# Patient Record
Sex: Female | Born: 1975 | Hispanic: Yes | Marital: Married | Smoking: Never smoker
Health system: Southern US, Community
[De-identification: ages and names within clinical notes are randomized; demographics above are authoritative.]

## PROBLEM LIST (undated history)

## (undated) DIAGNOSIS — E079 Disorder of thyroid, unspecified: Secondary | ICD-10-CM

## (undated) HISTORY — PX: TUBAL LIGATION: SHX77

## (undated) HISTORY — DX: Disorder of thyroid, unspecified: E07.9

---

## 2017-07-09 ENCOUNTER — Other Ambulatory Visit (HOSPITAL_COMMUNITY)
Admission: RE | Admit: 2017-07-09 | Discharge: 2017-07-09 | Disposition: A | Discharge status: Home / Self Care | Payer: Self-pay | Source: Ambulatory Visit | Attending: Pathology | Admitting: Pathology

## 2020-01-02 ENCOUNTER — Ambulatory Visit: Payer: Self-pay | Attending: Internal Medicine

## 2020-01-02 DIAGNOSIS — Z23 Encounter for immunization: Secondary | ICD-10-CM

## 2020-01-02 NOTE — Progress Notes (Signed)
   Covid-19 Vaccination Clinic  Name:  Tiffony Kite    MRN: [id number] DOB: [birth year]  01/02/2020  Ms. Wonda Olds was observed post Covid-19 immunization for 15 minutes without incident. She was provided with Vaccine Information Sheet and instruction to access the V-Safe system. Medical Interpreter used.  Ms. Wonda Olds was instructed to call 911 with any severe reactions post vaccine: Marland Kitchen Difficulty breathing  . Swelling of face and throat  . A fast heartbeat  . A bad rash all over body  . Dizziness and weakness   Immunizations Administered    Name Date Dose VIS Date Route   Pfizer COVID-19 Vaccine 01/02/2020  4:01 PM 0.3 mL 09/12/2019 Intramuscular   Manufacturer: [employer], [employer]   Lot: [id number]   NDC: [address]-0

## 2020-01-23 ENCOUNTER — Ambulatory Visit: Payer: Self-pay | Attending: Internal Medicine

## 2020-01-23 DIAGNOSIS — Z23 Encounter for immunization: Secondary | ICD-10-CM

## 2020-01-23 NOTE — Progress Notes (Signed)
   Covid-19 Vaccination Clinic  Name:  Denise Gould    MRN: [id number] DOB: [birth year]  01/23/2020  [name] was observed post Covid-19 immunization for 15 minutes without incident. She was provided with Vaccine Information Sheet and instruction to access the V-Safe system.   [name] was instructed to call 911 with any severe reactions post vaccine: Marland Kitchen Difficulty breathing  . Swelling of face and throat  . A fast heartbeat  . A bad rash all over body  . Dizziness and weakness   Immunizations Administered    Name Date Dose VIS Date Route   Pfizer COVID-19 Vaccine 01/23/2020  3:42 PM 0.3 mL 11/26/2018 Intramuscular   Manufacturer: [employer], [employer]   Lot: [id number]   NDC: [address]-2

## 2020-09-27 ENCOUNTER — Ambulatory Visit: Payer: Self-pay | Attending: Internal Medicine

## 2020-09-27 DIAGNOSIS — Z23 Encounter for immunization: Secondary | ICD-10-CM

## 2020-09-27 NOTE — Progress Notes (Signed)
   Covid-19 Vaccination Clinic  Name:  Denise Gould    MRN: [id number] DOB: 08-04-1976  09/27/2020  [name] was observed post Covid-19 immunization for 15 minutes without incident. She was provided with Vaccine Information Sheet and instruction to access the V-Safe system.   [name] was instructed to call 911 with any severe reactions post vaccine: Marland Kitchen Difficulty breathing  . Swelling of face and throat  . A fast heartbeat  . A bad rash all over body  . Dizziness and weakness   Immunizations Administered    Name Date Dose VIS Date Route   Pfizer COVID-19 Vaccine 09/27/2020  3:13 PM 0.3 mL 07/21/2020 Intramuscular   Manufacturer: [employer], [employer]   Lot: [id number]   NDC: [address]-1

## 2021-02-01 ENCOUNTER — Other Ambulatory Visit: Payer: Self-pay | Admitting: Physician Assistant

## 2021-03-08 ENCOUNTER — Encounter (INDEPENDENT_AMBULATORY_CARE_PROVIDER_SITE_OTHER): Payer: Self-pay

## 2021-03-08 ENCOUNTER — Encounter: Payer: Self-pay | Admitting: *Deleted

## 2021-03-08 ENCOUNTER — Ambulatory Visit
Admission: RE | Admit: 2021-03-08 | Discharge: 2021-03-08 | Disposition: A | Discharge status: Home / Self Care | Payer: Self-pay | Source: Ambulatory Visit | Attending: Oncology | Admitting: Oncology

## 2021-03-08 ENCOUNTER — Other Ambulatory Visit: Payer: Self-pay

## 2021-03-08 ENCOUNTER — Ambulatory Visit: Payer: Self-pay | Attending: Oncology | Admitting: *Deleted

## 2021-03-08 VITALS — BP 130/93 | HR 80 | Temp 100.0°F | Ht 61.02 in | Wt 155.3 lb

## 2021-03-08 DIAGNOSIS — Z0000 Encounter for general adult medical examination without abnormal findings: Secondary | ICD-10-CM | POA: Insufficient documentation

## 2021-03-08 NOTE — Progress Notes (Signed)
  Subjective:     Patient ID: Denise Gould, female   DOB: Feb 19, 1976, 45 y.o.   MRN: [id number]  HPI   [facility] Medical History Record - 03/08/21 9211      Breast History   Screening cycle New    Provider (CBE) [facility]    Initial Mammogram 03/08/21    Last Mammogram Annual    Last Mammogram Date --   6 years ago in [place]   Recent Breast Symptoms None      Breast Cancer History   Breast Cancer History No personal or family history      Previous History of Breast Problems   Breast Surgery or Biopsy --   bilateral benign "bumps"   Breast Implants N/A    BSE Done Never      Gynecological/Obstetrical History   LMP 03/01/21    Is there any chance that the client could be pregnant?  No    Age at menarche 22    Age at menopause n/a    PAP smear history Annually    Date of last PAP  12/17/18    Provider (PAP) [facility]    Age at first live birth 45    Breast fed children No    DES Exposure No    Cervical, Uterine or Ovarian cancer No    Family history of Cervial, Uterine or Ovarian cancer No    Hysterectomy No    Cervix removed No    Ovaries removed No    Laser/Cryosurgery No    Current method of birth control --   tubal ligation   Current method of Estrogen/Hormone replacement None    Smoking history None    Comments No insurance            Review of Systems     Objective:   Physical Exam Chest:  Breasts:     Right: No swelling, bleeding, inverted nipple, mass, nipple discharge, skin change, tenderness, axillary adenopathy or supraclavicular adenopathy.     Left: No swelling, bleeding, inverted nipple, mass, nipple discharge, skin change, tenderness, axillary adenopathy or supraclavicular adenopathy.    Lymphadenopathy:     Upper Body:     Right upper body: No supraclavicular or axillary adenopathy.     Left upper body: No supraclavicular or axillary adenopathy.        Assessment:     45 year old Hispanic female presents to [facility]  for clinical breast exam and mammogram only.  Loyda, the interpreter present during the interview and exam.  Clinical breast exam unremarkable.  Taught self breast awareness.  Patient states her last mammogram in [place] was abnormal, and she had 2 benign biopsies.  Imaging, or the report are not available for review today.  I did confirm that the [facility] does her her previous images.  Last pap smear on 12/17/18 was negative / negative.  Next pap due in 2025.  Patient has been screened for eligibility.  She does not have any insurance, Medicare or Medicaid.  She also meets financial eligibility.   Risk Assessment    Risk Scores      03/08/2021   Last edited by: Scarlett Presto, RN   5-year risk: 0.8 %   Lifetime risk: 7.4 %            Plan:     Screening mammogram ordered.  Will follow up per [facility]CP protocol.

## 2021-03-08 NOTE — Patient Instructions (Signed)
Gave patient hand-out, Women Staying Healthy, Active and Well from [facility], with education on breast health, pap smears, heart and colon health. 

## 2021-03-10 ENCOUNTER — Encounter: Payer: Self-pay | Admitting: *Deleted

## 2021-03-10 NOTE — Progress Notes (Signed)
Letter mailed from the [facility] to inform patient of her normal mammogram results.  Patient is to follow-up with annual screening in one year. 

## 2022-03-10 ENCOUNTER — Other Ambulatory Visit: Payer: Self-pay

## 2022-03-10 DIAGNOSIS — Z1211 Encounter for screening for malignant neoplasm of colon: Secondary | ICD-10-CM

## 2022-03-10 DIAGNOSIS — Z1231 Encounter for screening mammogram for malignant neoplasm of breast: Secondary | ICD-10-CM

## 2022-03-21 ENCOUNTER — Ambulatory Visit: Payer: Self-pay | Attending: Hematology and Oncology | Admitting: *Deleted

## 2022-03-21 ENCOUNTER — Ambulatory Visit
Admission: RE | Admit: 2022-03-21 | Discharge: 2022-03-21 | Disposition: A | Discharge status: Home / Self Care | Payer: Self-pay | Source: Ambulatory Visit | Attending: Obstetrics and Gynecology | Admitting: Obstetrics and Gynecology

## 2022-03-21 ENCOUNTER — Encounter: Payer: Self-pay | Admitting: *Deleted

## 2022-03-21 VITALS — BP 115/89 | Wt 161.0 lb

## 2022-03-21 DIAGNOSIS — Z01419 Encounter for gynecological examination (general) (routine) without abnormal findings: Secondary | ICD-10-CM

## 2022-03-21 DIAGNOSIS — Z1231 Encounter for screening mammogram for malignant neoplasm of breast: Secondary | ICD-10-CM | POA: Insufficient documentation

## 2022-03-21 NOTE — Progress Notes (Deleted)
  Subjective:     Patient ID: Denise Gould, female   DOB: 1976-03-21, 46 y.o.   MRN: [id number]  HPI   Review of Systems     Objective:   Physical Exam Neck:      Comments: Patient has been followed for enlarging Thyroid with ultrasound in 2021, and 2022.  She is scheduled for another ultrasound in August of 2023 per patient.  Patient states her doctor is aware that it has grown.     Assessment:     ***    Plan:     ***

## 2022-03-21 NOTE — Progress Notes (Signed)
[name] is a 46 y.o. female who presents to [facility] clinic today with no complaints.  She is here for her well woman visit.    Pap Smear: Pap not smear completed today. Last Pap smear was on 12/17/18 at a [facility] clinic and was normal with a negative / negative result. Per patient has no history of an abnormal Pap smear. Last Pap smear result is available in Epic.   Physical exam: Breasts Breasts symmetrical. No skin abnormalities bilateral breasts. No nipple retraction bilateral breasts. No nipple discharge bilateral breasts. No lymphadenopathy. No lumps palpated bilateral breasts.       Pelvic/Bimanual Pap is not indicated today    My Note      1:50 PM   Edit Delete Copy   Expand All Collapse All[] Expand All by Default   Subjective:      Subjective  Patient ID: Denise Gould, female   DOB: 08/26/1976, 46 y.o.   MRN: [id number]   HPI     Review of Systems       Objective:    Objective  Physical Exam Neck:      Comments: Patient has been followed for enlarging Thyroid with ultrasound in 2021, and 2022.  She is scheduled for another ultrasound in August of 2023 per patient.  Patient states her doctor is aware that it has grown.       Assessment:    Assessment      Plan:    Plan          My Note     1:14 PM   Edit Delete Copy   [name] is a 46 y.o. female who presents to [facility] clinic today with no complaints.  She is here for her well woman visit.    Pap Smear: Pap not smear completed today. Last Pap smear was on 12/17/18 at a [facility] clinic and was normal with a negative / negative result. Per patient has no history of an abnormal Pap smear. Last Pap smear result is available in Epic.   Physical exam: Breasts Breasts symmetrical. No skin abnormalities bilateral breasts. No nipple retraction bilateral breasts. No nipple discharge bilateral breasts. No lymphadenopathy. No lumps palpated bilateral breasts.        Pelvic/Bimanual Pap is not indicated today    Smoking History: Patient has never smoked    Patient Navigation: Patient education provided. Access to services provided for patient through B[facility]CP program. Marchelle Folks, the [facility] interpreter provided interpretation. No transportation provided    Colorectal Cancer Screening: Per patient has never had colonoscopy completed.  Patient had a FIT test in 2022 that was negative.  No complaints today.    Breast and Cervical Cancer Risk Assessment: Patient does not have family history of breast cancer, known genetic mutations, or radiation treatment to the chest before age 70. Patient does not have history of cervical dysplasia, immunocompromised, or DES exposure in-utero.   Risk Assessment       Risk Scores         03/21/2022 03/08/2021    Last edited by: Narda Rutherford, LPN Scarlett Presto, RN    5-year risk: 0.8 % 0.8 %    Lifetime risk: 7.2 % 7.4 %                  A: B[facility]CP exam without pap smear   P: Referred patient to the [facility] for a screening mammogram. Appointment scheduled today.   [name]  Judie Petit, RN 03/21/2022 1:14 PM          Smoking History: Patient has never smoked    Patient Navigation: Patient education provided. Access to services provided for patient through B[facility]CP program. Marchelle Folks, the [facility] interpreter provided interpretation. No transportation provided   Colorectal Cancer Screening: Per patient has never had colonoscopy completed.  Patient had a FIT test in 2022 that was negative.  No complaints today.    Breast and Cervical Cancer Risk Assessment: Patient does not have family history of breast cancer, known genetic mutations, or radiation treatment to the chest before age 13. Patient does not have history of cervical dysplasia, immunocompromised, or DES exposure in-utero.  Risk Assessment     Risk Scores       03/21/2022 03/08/2021   Last edited by: Narda Rutherford, LPN Scarlett Presto, RN    5-year risk: 0.8 % 0.8 %   Lifetime risk: 7.2 % 7.4 %            A: [facility]CP exam without pap smear  P: Referred patient to the [facility] for a screening mammogram. Appointment scheduled today.  Jim Like, RN 03/21/2022 1:14 PM

## 2023-03-13 ENCOUNTER — Other Ambulatory Visit: Payer: Self-pay

## 2023-03-13 DIAGNOSIS — Z1231 Encounter for screening mammogram for malignant neoplasm of breast: Secondary | ICD-10-CM

## 2023-04-09 ENCOUNTER — Ambulatory Visit
Admission: RE | Admit: 2023-04-09 | Discharge: 2023-04-09 | Disposition: A | Discharge status: Home / Self Care | Payer: Self-pay | Source: Ambulatory Visit | Attending: Obstetrics and Gynecology | Admitting: Obstetrics and Gynecology

## 2023-04-09 ENCOUNTER — Ambulatory Visit: Payer: Self-pay | Attending: Hematology and Oncology | Admitting: Hematology and Oncology

## 2023-04-09 VITALS — BP 113/90 | Wt 154.5 lb

## 2023-04-09 DIAGNOSIS — Z1231 Encounter for screening mammogram for malignant neoplasm of breast: Secondary | ICD-10-CM

## 2023-04-09 MED ORDER — NYSTATIN-TRIAMCINOLONE 100000-0.1 UNIT/GM-% EX OINT: 1.0000 | TOPICAL_OINTMENT | Freq: Two times a day (BID) | CUTANEOUS | 1 refills | Status: AC

## 2023-04-09 NOTE — Progress Notes (Signed)
Denise Gould is a 47 y.o. female who presents to [facility] clinic today with no complaints.    Pap Smear: Pap not smear completed today. Last Pap smear was 12/17/2018 at [facility] clinic and was normal. Per patient has no history of an abnormal Pap smear. Last Pap smear result is available in Epic.   Physical exam: Breasts Breasts symmetrical. No skin abnormalities bilateral breasts. No nipple retraction bilateral breasts. No nipple discharge bilateral breasts. No lymphadenopathy. No lumps palpated bilateral breasts.   MS DIGITAL SCREENING TOMO BILATERAL  Result Date: 03/22/2022 CLINICAL DATA:  Screening. EXAM: DIGITAL SCREENING BILATERAL MAMMOGRAM WITH TOMOSYNTHESIS AND CAD TECHNIQUE: Bilateral screening digital craniocaudal and mediolateral oblique mammograms were obtained. Bilateral screening digital breast tomosynthesis was performed. The images were evaluated with computer-aided detection. COMPARISON:  Previous exam(s). ACR Breast Density Category b: There are scattered areas of fibroglandular density. FINDINGS: There are no findings suspicious for malignancy. IMPRESSION: No mammographic evidence of malignancy. A result letter of this screening mammogram will be mailed directly to the patient. RECOMMENDATION: Screening mammogram in one year. (Code:SM-B-01Y) BI-RADS CATEGORY  1: Negative. Electronically Signed   By: Sherron Ales M.D.   On: 03/22/2022 17:39  MS DIGITAL SCREENING TOMO BILATERAL  Result Date: 03/09/2021 CLINICAL DATA:  Screening. EXAM: DIGITAL SCREENING BILATERAL MAMMOGRAM WITH TOMOSYNTHESIS AND CAD TECHNIQUE: Bilateral screening digital craniocaudal and mediolateral oblique mammograms were obtained. Bilateral screening digital breast tomosynthesis was performed. The images were evaluated with computer-aided detection. COMPARISON:  Previous exam(s). ACR Breast Density Category c: The breast tissue is heterogeneously dense, which may obscure small masses. FINDINGS: There  are no findings suspicious for malignancy. The images were evaluated with computer-aided detection. IMPRESSION: No mammographic evidence of malignancy. A result letter of this screening mammogram will be mailed directly to the patient. RECOMMENDATION: Screening mammogram in one year. (Code:SM-B-01Y) BI-RADS CATEGORY  1: Negative. Electronically Signed   By: Sherian Rein M.D.   On: 03/09/2021 14:28        Pelvic/Bimanual Pap is not indicated today    Smoking History: Patient has never smoked and was not referred to quit line.    Patient Navigation: Patient education provided. Access to services provided for patient through B[facility]CP program. Delos Haring interpreter provided. No transportation provided   Colorectal Cancer Screening: Per patient has never had colonoscopy completed No complaints today. FIT test given.    Breast and Cervical Cancer Risk Assessment: Patient does not have family history of breast cancer, known genetic mutations, or radiation treatment to the chest before age 31. Patient does not have history of cervical dysplasia, immunocompromised, or DES exposure in-utero.  Risk Scores as of 04/09/2023     Denise Gould           5-year 0.78 %   Lifetime 8.4 %   This patient is Hisp[place]/Latina but has no documented birth country, so the [place] model used data from [place] patients to calculate their risk score. Document a birth country in the Demographics activity for a more accurate score.         Last calculated by Narda Rutherford, LPN on 10/07/1094 at  2:07 PM        A: [facility]CP exam without pap smear No complaints with benign exam.   P: Referred patient to the [facility] for a screening mammogram. Appointment scheduled 04/09/23.  Pascal Lux, NP 04/09/2023 2:28 PM

## 2023-04-09 NOTE — Patient Instructions (Signed)
Taught Denise Gould about self breast awareness and gave educational materials to take home. Patient did not need a Pap smear today due to last Pap smear was in 12/17/2018 per patient. Let her know [facility] will cover Pap smears every 5 years unless has a history of abnormal Pap smears. Referred patient to the [facility] for diagnostic mammogram. Appointment scheduled for 04/09/2023. Patient aware of appointment and will be there. Let patient know will follow up with her within the next couple weeks with results. Denise Gould verbalized understanding.  Pascal Lux, NP 2:30 PM

## 2024-02-16 IMAGING — MG MM DIGITAL SCREENING BILAT W/ TOMO AND CAD
8 series · 9 of 24 positions shown · non-contrast
Comparison: Previous exam(s).

CLINICAL DATA: Screening.

EXAM:
DIGITAL SCREENING BILATERAL MAMMOGRAM WITH TOMOSYNTHESIS AND CAD
TECHNIQUE: Bilateral screening digital craniocaudal and mediolateral oblique
mammograms were obtained. Bilateral screening digital breast
tomosynthesis was performed. The images were evaluated with
computer-aided detection.

[R MLO synth-2D]
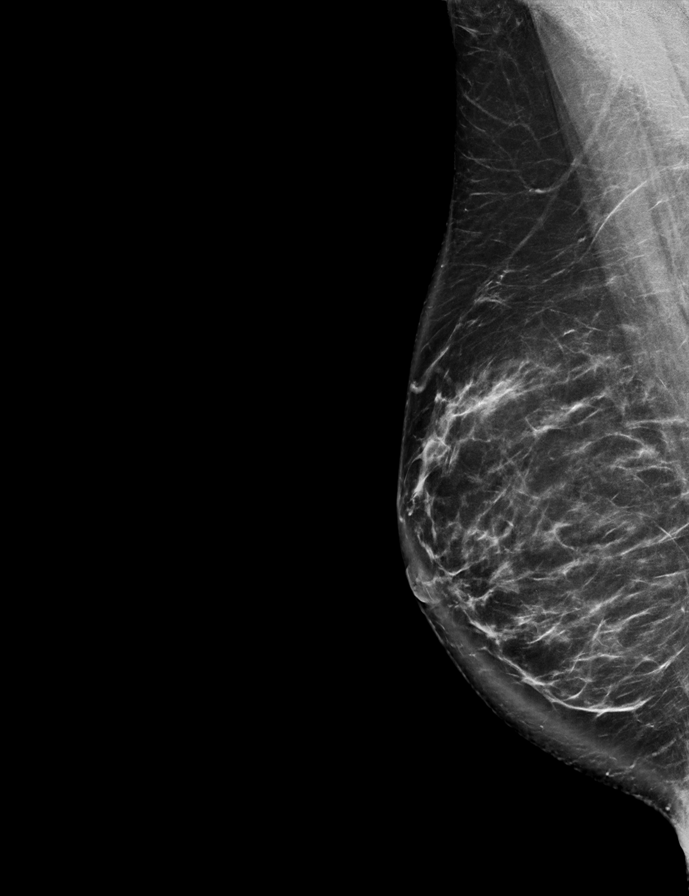

[R CC synth-2D]
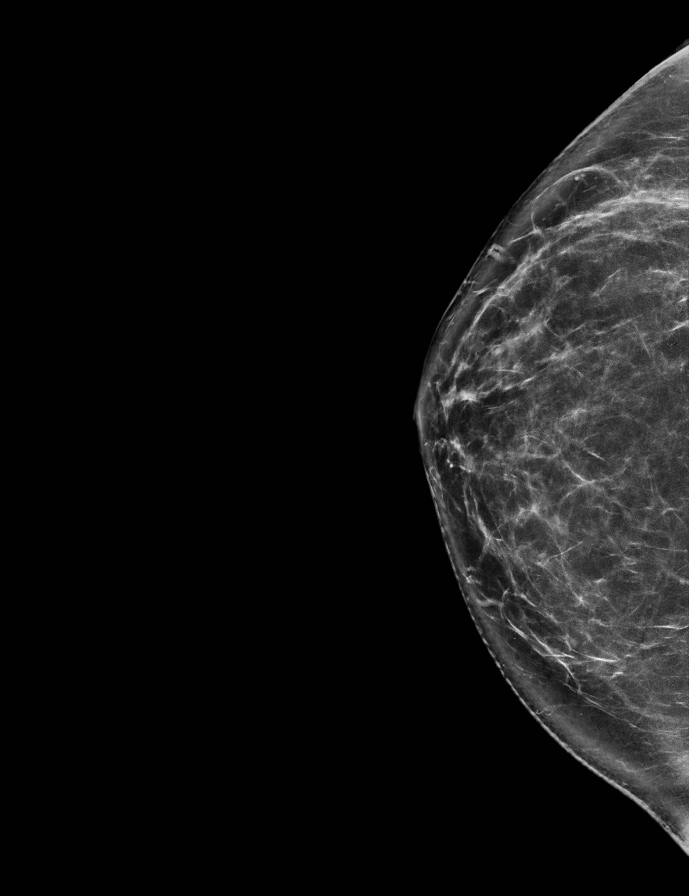

[L MLO synth-2D]
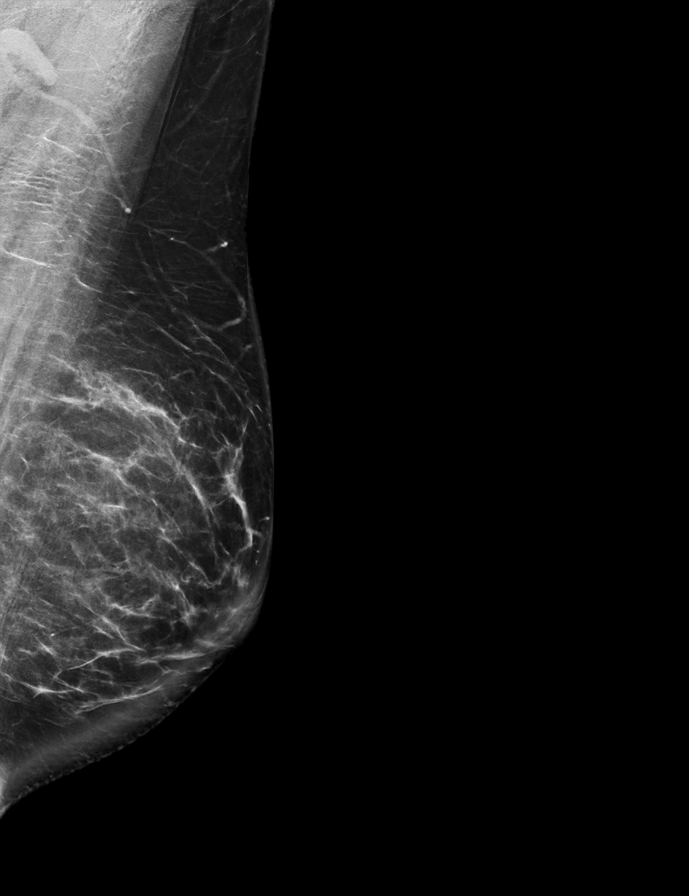

[L CC synth-2D]
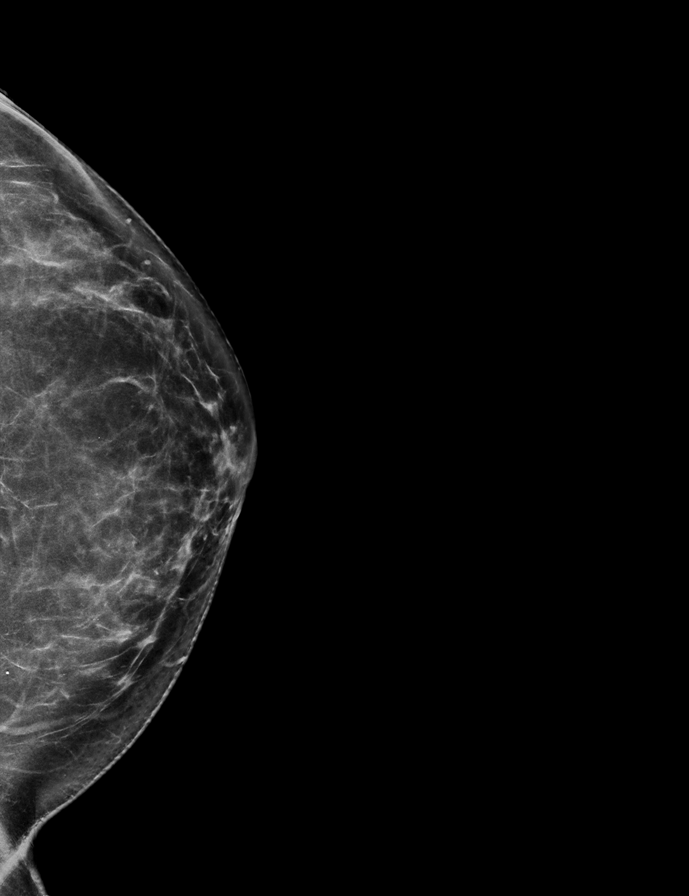

[L MLO tomo · 2 of 91 frames shown]
[frame 30/91]
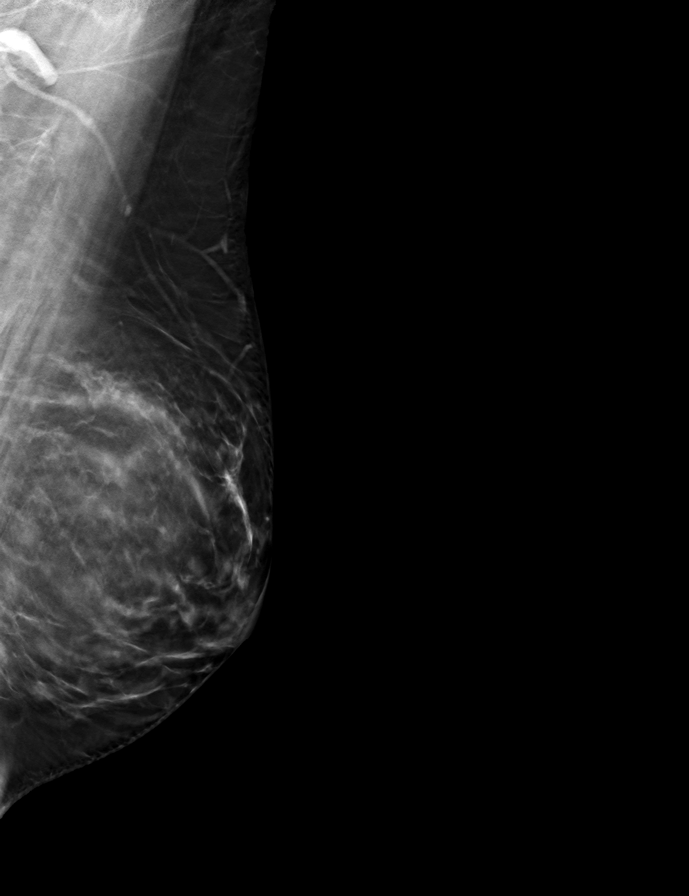
[frame 46/91]
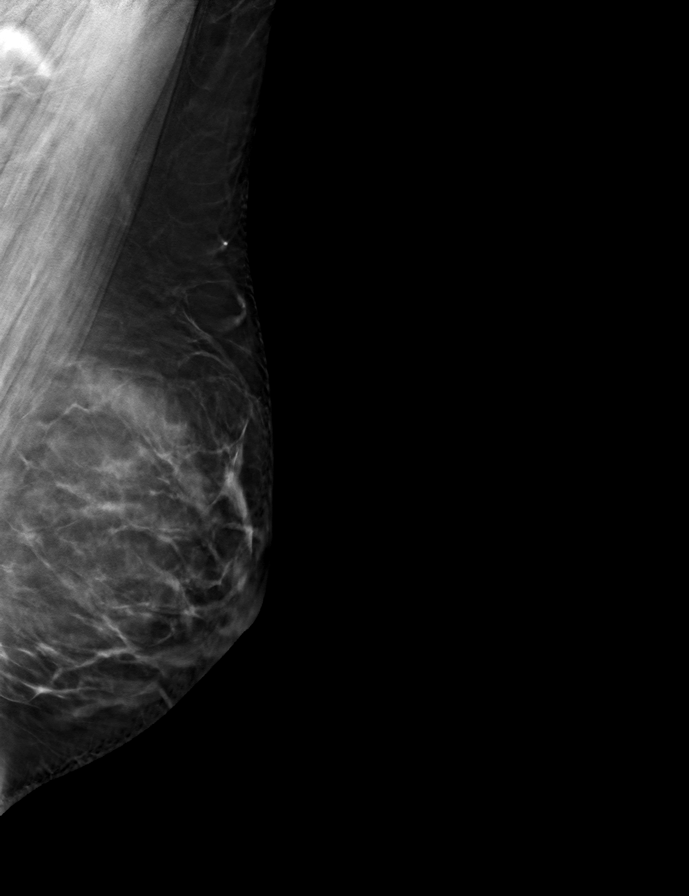

[L CC tomo · tomo slice 35/68.0]
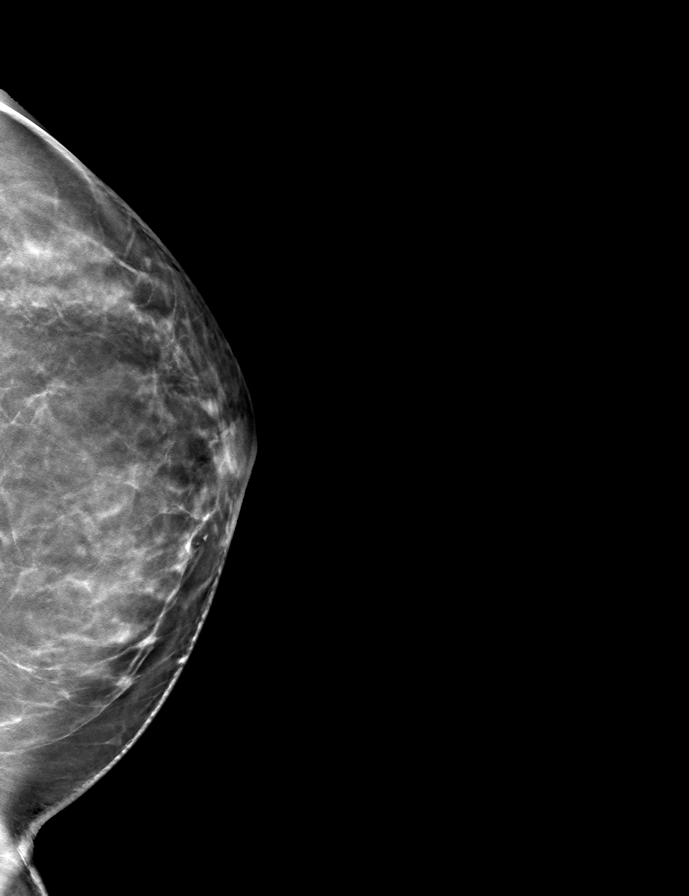

[R CC tomo · tomo slice 40/79.0]
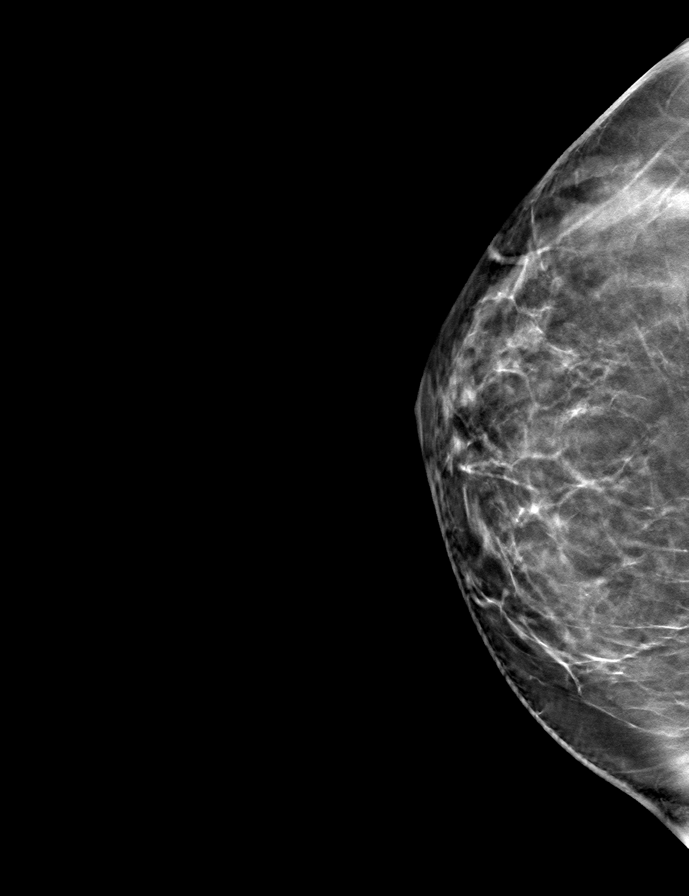

[R MLO tomo · tomo slice 46/91.0]
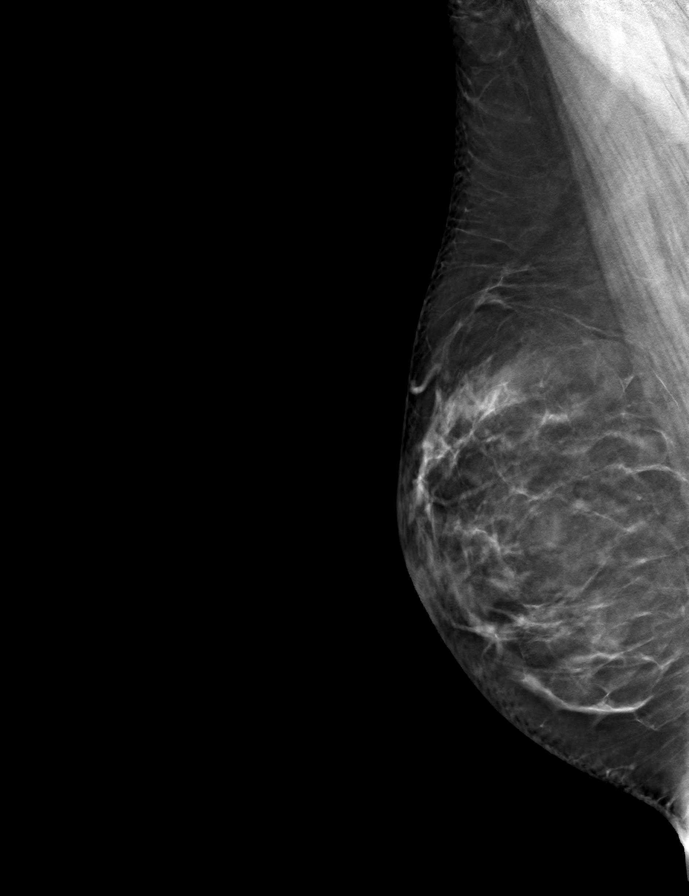

[9 of 24 positions shown; findings below may reference images not displayed]

ACR Breast Density Category b: There are scattered areas of
fibroglandular density.
FINDINGS: There are no findings suspicious for malignancy.
IMPRESSION: No mammographic evidence of malignancy. A result letter of this
screening mammogram will be mailed directly to the patient.

RECOMMENDATION:
Screening mammogram in one year. (Code:51-O-LD2)

BI-RADS CATEGORY  1: Negative.

## 2024-03-18 ENCOUNTER — Other Ambulatory Visit: Payer: Self-pay | Admitting: Family Medicine

## 2024-03-18 DIAGNOSIS — Z1231 Encounter for screening mammogram for malignant neoplasm of breast: Secondary | ICD-10-CM

## 2024-03-28 ENCOUNTER — Ambulatory Visit
Admission: RE | Admit: 2024-03-28 | Discharge: 2024-03-28 | Disposition: A | Discharge status: Home / Self Care | Payer: Self-pay | Source: Ambulatory Visit | Attending: Family Medicine | Admitting: Family Medicine

## 2024-03-28 DIAGNOSIS — Z1231 Encounter for screening mammogram for malignant neoplasm of breast: Secondary | ICD-10-CM | POA: Insufficient documentation

## 2024-09-14 DIAGNOSIS — A048 Other specified bacterial intestinal infections: Secondary | ICD-10-CM | POA: Insufficient documentation

## 2024-09-23 NOTE — Progress Notes (Signed)
 Sleep Medicine   Office Visit  Patient Name: Denise Gould DOB: 1976/07/21 MRN [id number]    Chief Complaint: sleep evaluation   Brief History:  Denise Gould presents for an initial consult for sleep evaluation and to establish care. Patient has years history of insomnia and interrupted sleep. Sleep quality is poor. This is noted most nights. The patient's bed partner reports some snoring at night. The patient relates the following symptoms: fatigue, some headaches, and some trouble concentrating and brain fog are also present. The patient goes to sleep between 1000 pm and midnight and wakes up at 0200 am and is unable to return to sleep. Sleep quality is the same when outside home environment.  Patient has noted significant movement of her legs at night that would disrupt her sleep.  The patient reports vivid dreams as unusual behavior during the night.  The patient reports no a history of psychiatric problems. The Epworth Sleepiness Score is 9 out of 24.  The patient reports cardiovascular risk factors as following: none.    ROS  General: (-) fever, (-) chills, (-) night sweat Nose and Sinuses: (-) nasal stuffiness or itchiness, (-) postnasal drip, (-) nosebleeds, (-) sinus trouble. Mouth and Throat: (-) sore throat, (-) hoarseness. Neck: (-) swollen glands, (-) enlarged thyroid, (-) neck pain. Respiratory: - cough, - shortness of breath, - wheezing. Neurologic: - numbness, - tingling. Psychiatric: - anxiety, - depression Sleep behavior: -sleep paralysis -hypnogogic hallucinations -dream enactment      -vivid dreams -cataplexy -night terrors -sleep walking   Current Medication: Outpatient Encounter Medications as of 09/29/2024  Medication Sig   famotidine (PEPCID) 20 MG tablet FAMOTIDINE 20 MG TABS   tetracycline (SUMYCIN) 500 MG capsule Take 500 mg by mouth 4 (four) times daily.   cholecalciferol (VITAMIN D) 25 MCG (1000 UNIT) tablet Take 1,000 Units by mouth daily.    levothyroxine (SYNTHROID) 88 MCG tablet Take 88 mcg by mouth daily.   metroNIDAZOLE (FLAGYL) 500 MG tablet Take by mouth 2 (two) times daily.   minoxidil (LONITEN) 2.5 MG tablet Take by mouth.   nystatin -triamcinolone  ointment (MYCOLOG) Apply 1 Application topically 2 (two) times daily.   pantoprazole (PROTONIX) 20 MG tablet Take 20 mg by mouth 2 (two) times daily.   PEPTO-BISMOL 262 MG chewable tablet Chew by mouth.   No facility-administered encounter medications on file as of 09/29/2024.    Surgical History: Past Surgical History:  Procedure Laterality Date   TUBAL LIGATION      Medical History: Past Medical History:  Diagnosis Date   Thyroid disease     Family History: Non contributory to the present illness  Social History: Social History   Socioeconomic History   Marital status: Married    Spouse name: Not on file   Number of children: 6   Years of education: Not on file   Highest education level: 8th grade  Occupational History   Not on file  Tobacco Use   Smoking status: Never   Smokeless tobacco: Never  Vaping Use   Vaping status: Never Used  Substance and Sexual Activity   Alcohol use: Not Currently   Drug use: Never   Sexual activity: Yes    Birth control/protection: Surgical  Other Topics Concern   Not on file  Social History Narrative   Not on file   Social Drivers of Health   Tobacco Use: Low Risk (09/29/2024)   Patient History    Smoking Tobacco Use: Never    Smokeless Tobacco Use: Never  Passive Exposure: Not on file  Financial Resource Strain: Not on file  Food Insecurity: No Food Insecurity (04/09/2023)   Hunger Vital Sign    Worried About Running Out of Food in the Last Year: Never true    Ran Out of Food in the Last Year: Never true  Transportation Needs: No Transportation Needs (04/09/2023)   PRAPARE - Administrator, Civil Service (Medical): No    Lack of Transportation (Non-Medical): No  Physical Activity: Not on file   Stress: Not on file  Social Connections: Not on file  Intimate Partner Violence: Not on file  Depression (EYV7-0): Not on file  Alcohol Screen: Not on file  Housing: Not on file  Utilities: Not on file  Health Literacy: Not on file    Vital Signs: Blood pressure (!) 116/93, pulse (!) 14, resp. rate 14, height 5' (1.524 m), weight 159 lb (72.1 kg), SpO2 96%. Body mass index is 31.05 kg/m.   Examination: General Appearance: The patient is well-developed, well-nourished, and in no distress. Neck Circumference: 37 cm Skin: Gross inspection of skin unremarkable. Head: normocephalic, no gross deformities. Eyes: no gross deformities noted. ENT: ears appear grossly normal Neurologic: Alert and oriented. No involuntary movements.    STOP BANG RISK ASSESSMENT S (snore) Have you been told that you snore?     YES   T (tired) Are you often tired, fatigued, or sleepy during the day?   YES  O (obstruction) Do you stop breathing, choke, or gasp during sleep? NO   P (pressure) Do you have or are you being treated for high blood pressure? NO   B (BMI) Is your body index greater than 35 kg/m? NO   A (age) Are you 68 years old or older? NO   N (neck) Do you have a neck circumference greater than 16 inches?   NO   G (gender) Are you a female? NO   TOTAL STOP/BANG YES ANSWERS 2                                                               A STOP-Bang score of 2 or less is considered low risk, and a score of 5 or more is high risk for having either moderate or severe OSA. For people who score 3 or 4, doctors may need to perform further assessment to determine how likely they are to have OSA.         EPWORTH SLEEPINESS SCALE:  Scale:  (0)= no chance of dozing; (1)= slight chance of dozing; (2)= moderate chance of dozing; (3)= high chance of dozing  Chance  Situtation    Sitting and reading: 0    Watching TV: 0    Sitting Inactive in public: 0    As a passenger in car: 3       Lying down to rest: 0    Sitting and talking: 0    Sitting quielty after lunch: 0    In a car, stopped in traffic: 6   TOTAL SCORE:   9 out of 24    SLEEP STUDIES:  None   LABS: No results found for this or any previous visit (from the past 2160 hours).  Radiology: MS 3D SCR MAMMO BILAT BR (aka MM) Result Date:  04/01/2024 CLINICAL DATA:  Screening. EXAM: DIGITAL SCREENING BILATERAL MAMMOGRAM WITH TOMOSYNTHESIS AND CAD TECHNIQUE: Bilateral screening digital craniocaudal and mediolateral oblique mammograms were obtained. Bilateral screening digital breast tomosynthesis was performed. The images were evaluated with computer-aided detection. COMPARISON:  Previous exam(s). ACR Breast Density Category b: There are scattered areas of fibroglandular density. FINDINGS: There are no findings suspicious for malignancy. IMPRESSION: No mammographic evidence of malignancy. A result letter of this screening mammogram will be mailed directly to the patient. RECOMMENDATION: Screening mammogram in one year. (Code:SM-B-01Y) BI-RADS CATEGORY  1: Negative. Electronically Signed   By: Alm Parkins M.D.   On: 04/01/2024 15:02    No results found.  No results found.    Assessment and Plan: Patient Active Problem List   Diagnosis Date Noted   Insomnia 09/29/2024   Helicobacter pylori infection 09/14/2024   1. Insomnia, unspecified type (Primary) Patient reports she is kept awake by her husband's snoring. She reports snoring and frequent awakening and restlessness. Recommend PSG for further evaluation of possible sleep disordered breathing.  Suggested she wear earplugs and have her husband evaluated for OSA in light of his snoring.   2. Frequent nocturnal awakening Await psg for evaluation .    General Counseling: I have discussed the findings of the evaluation and examination with Meilah.  I have also discussed any further diagnostic evaluation thatmay be needed or ordered today. Mekesha  verbalizes understanding of the findings of todays visit. We also reviewed her medications today and discussed drug interactions and side effects including but not limited excessive drowsiness and altered mental states. We also discussed that there is always a risk not just to her but also people around her. she has been encouraged to call the office with any questions or concerns that should arise related to todays visit.  No orders of the defined types were placed in this encounter.       I have personally obtained a history, evaluated the patient, evaluated pertinent data, formulated the assessment and plan and placed orders.  This patient was seen today by Lauraine Lay, PA-C in collaboration with Dr. Elfreda Bathe.    Elfreda DELENA Bathe, MD [facility] Diplomate ABMS Pulmonary and Critical Care Medicine Sleep medicine

## 2024-09-29 ENCOUNTER — Ambulatory Visit: Payer: Self-pay | Admitting: Internal Medicine

## 2024-09-29 VITALS — BP 116/93 | HR 14 | Resp 14 | Ht 60.0 in | Wt 159.0 lb

## 2024-09-29 DIAGNOSIS — G4700 Insomnia, unspecified: Secondary | ICD-10-CM
